# Patient Record
Sex: Female | Born: 1945 | Race: White | Hispanic: No
Health system: Southern US, Community
[De-identification: ages and names within clinical notes are randomized; demographics above are authoritative.]

## PROBLEM LIST (undated history)

## (undated) DIAGNOSIS — E119 Type 2 diabetes mellitus without complications: Secondary | ICD-10-CM

## (undated) DIAGNOSIS — I10 Essential (primary) hypertension: Secondary | ICD-10-CM

## (undated) DIAGNOSIS — Z87891 Personal history of nicotine dependence: Principal | ICD-10-CM

## (undated) HISTORY — DX: Personal history of nicotine dependence: Z87.891

---

## 2005-03-28 ENCOUNTER — Ambulatory Visit: Payer: Self-pay | Admitting: Internal Medicine

## 2006-04-04 ENCOUNTER — Ambulatory Visit: Payer: Self-pay | Admitting: Internal Medicine

## 2007-04-06 ENCOUNTER — Ambulatory Visit: Payer: Self-pay | Admitting: Internal Medicine

## 2008-04-07 ENCOUNTER — Ambulatory Visit: Payer: Self-pay | Admitting: Internal Medicine

## 2009-04-09 ENCOUNTER — Ambulatory Visit: Payer: Self-pay | Admitting: Internal Medicine

## 2010-04-13 ENCOUNTER — Ambulatory Visit: Payer: Self-pay | Admitting: Internal Medicine

## 2011-06-04 ENCOUNTER — Ambulatory Visit: Payer: Self-pay | Admitting: Specialist

## 2012-03-16 ENCOUNTER — Ambulatory Visit: Payer: Self-pay | Admitting: Ophthalmology

## 2012-04-17 ENCOUNTER — Ambulatory Visit: Payer: Self-pay | Admitting: Internal Medicine

## 2012-05-30 ENCOUNTER — Ambulatory Visit: Payer: Self-pay

## 2012-07-11 ENCOUNTER — Ambulatory Visit: Payer: Self-pay | Admitting: Neurology

## 2012-07-11 LAB — CREATININE, SERUM: Creatinine: 0.63 mg/dL (ref 0.60–1.30)

## 2013-04-18 ENCOUNTER — Ambulatory Visit: Payer: Self-pay

## 2013-07-25 ENCOUNTER — Ambulatory Visit: Payer: Self-pay

## 2014-04-21 ENCOUNTER — Ambulatory Visit: Payer: Self-pay

## 2015-01-18 NOTE — Op Note (Signed)
PATIENT NAME:  Sonia Watkins, Sonia Watkins MR#:  [id number] DATE OF BIRTH:  12/29/1945  DATE OF PROCEDURE:  03/16/2012  SURGEON: Viviann SpareSteven A. Payslie Mccaig, M.D.   PREOPERATIVE DIAGNOSIS:  Cataract, right eye.   POSTOPERATIVE DIAGNOSIS:  Cataract, right eye.  PROCEDURE PERFORMED:  Extracapsular cataract extraction using phacoemulsification with placement of an Alcon [id number], 20.0-diopter posterior chamber lens, serial #[id number].  SURGEON:  Maylon PeppersSteven A. Langley Flatley, MD  ASSISTANT:  None.  ANESTHESIA:  4% lidocaine and 0.75% Marcaine in a 50/50 mixture with 10 units/mL of Hylenex added, given as a peribulbar.  ANESTHESIOLOGIST:  Yves DillPaul Carroll, MD   COMPLICATIONS:  None.  ESTIMATED BLOOD LOSS:  Less than 1 mL.  DESCRIPTION OF PROCEDURE:  The patient was brought to the operating room and given a peribulbar block.  The patient was then prepped and draped in the usual fashion.  The vertical rectus muscles were imbricated using 5-0 silk sutures.  These sutures were then clamped to the sterile drapes as bridle sutures.  A limbal peritomy was performed extending two clock hours and hemostasis was obtained with cautery.  A partial thickness scleral groove was made at the surgical limbus and dissected anteriorly in a lamellar dissection using an Alcon crescent knife.  The anterior chamber was entered superonasally with a Superblade and through the lamellar dissection with a 2.6 mm keratome.  DisCoVisc was used to replace the aqueous and a continuous tear capsulorrhexis was carried out.  Hydrodissection and hydrodelineation were carried out with balanced salt and a 27 gauge canula.  The nucleus was rotated to confirm the effectiveness of the hydrodissection.  Phacoemulsification was carried out using a divide-and-conquer technique.  Total ultrasound time was 1 minute and 29 seconds with an average power of 23.5 percent.  CDE of 40.09.  Irrigation/aspiration was used to remove the residual cortex.  DisCoVisc was used  to inflate the capsule and the internal incision was enlarged to 3 mm with the crescent knife.  The intraocular lens was folded and inserted into the capsular bag using the AcrySert Delivery System.   Irrigation/aspiration was used to remove the residual DisCoVisc.  Miostat was injected into the anterior chamber through the paracentesis track to inflate the anterior chamber and induce miosis.  The wound was checked for leaks and none were found. The conjunctiva was closed with cautery and the bridle sutures were removed.  Two drops of 0.3% Vigamox were placed on the eye.   An eye shield was placed on the eye.  The patient was discharged to the recovery room in good condition.  ____________________________ Maylon PeppersSteven A. Viktor Philipp, MD sad:cbb D: 03/16/2012 12:09:22 ET T: 03/16/2012 12:34:27 ET JOB#: [id number]  cc: Viviann SpareSteven A. Wlliam Grosso, MD, <Dictator> Erline LevineSTEVEN A Roberta Kelly MD ELECTRONICALLY SIGNED 03/19/2012 14:37

## 2015-04-08 ENCOUNTER — Other Ambulatory Visit: Payer: Self-pay | Admitting: Family Medicine

## 2015-04-08 ENCOUNTER — Encounter: Payer: Self-pay | Admitting: Family Medicine

## 2015-04-08 DIAGNOSIS — Z87891 Personal history of nicotine dependence: Secondary | ICD-10-CM

## 2015-04-08 HISTORY — DX: Personal history of nicotine dependence: Z87.891

## 2015-04-09 ENCOUNTER — Other Ambulatory Visit: Payer: Self-pay | Admitting: Family Medicine

## 2015-04-09 DIAGNOSIS — Z87891 Personal history of nicotine dependence: Secondary | ICD-10-CM

## 2015-04-10 ENCOUNTER — Ambulatory Visit
Admission: RE | Admit: 2015-04-10 | Discharge: 2015-04-10 | Disposition: A | Discharge status: Home / Self Care | Payer: Medicare Other | Source: Ambulatory Visit | Attending: Family Medicine | Admitting: Family Medicine

## 2015-04-10 ENCOUNTER — Inpatient Hospital Stay: Payer: Medicare Other | Attending: Family Medicine | Admitting: Family Medicine

## 2015-04-10 DIAGNOSIS — Z87891 Personal history of nicotine dependence: Secondary | ICD-10-CM | POA: Insufficient documentation

## 2015-04-10 DIAGNOSIS — Z122 Encounter for screening for malignant neoplasm of respiratory organs: Secondary | ICD-10-CM

## 2015-04-10 DIAGNOSIS — I2510 Atherosclerotic heart disease of native coronary artery without angina pectoris: Secondary | ICD-10-CM | POA: Insufficient documentation

## 2015-04-10 NOTE — Progress Notes (Signed)
In accordance with CMS guidelines, patient has meet eligibility criteria including age, absence of signs or symptoms of lung cancer, the specific calculation of cigarette smoking pack-years was 95 years and is a former smoker having quit recently in early 2016.   A shared decision-making session was conducted prior to the performance of CT scan. This includes one or more decision aids, includes benefits and harms of screening, follow-up diagnostic testing, over-diagnosis, false positive rate, and total radiation exposure.  Counseling on the importance of adherence to annual lung cancer LDCT screening, impact of co-morbidities, and ability or willingness to undergo diagnosis and treatment is imperative for compliance of the program.  Counseling on the importance of continued smoking cessation for former smokers; the importance of smoking cessation for current smokers and information about tobacco cessation interventions have been given to patient including the [place] [facility], [address] quit [place], as well as Cancer Center specific smoking cessation programs.  Written order for lung cancer screening with LDCT has been given to the patient and any and all questions have been answered to the best of my abilities.   Yearly follow up will be scheduled by Glenna FellowsShawn Perkins, Thoracic Navigator.

## 2015-04-13 ENCOUNTER — Telehealth: Payer: Self-pay | Admitting: *Deleted

## 2015-04-13 NOTE — Telephone Encounter (Signed)
  Oncology Nurse Navigator Documentation    Navigator Encounter Type: Telephone;Screening (04/13/15 1400)               Notified patient of LDCT lung cancer screening results of Lung Rads  2 finding with recommendation for 12 month follow up imaging. Also notified of incidental finding noted below. Verbalized understanding.   IMPRESSION: 1. Lung-RADS Category 2, benign appearance or behavior. Continue annual screening with low-dose chest CT without contrast in 12 months. 2. Coronary artery calcification.

## 2016-03-18 ENCOUNTER — Other Ambulatory Visit: Payer: Self-pay | Admitting: Specialist

## 2016-03-18 DIAGNOSIS — M7541 Impingement syndrome of right shoulder: Secondary | ICD-10-CM

## 2016-03-18 DIAGNOSIS — M7542 Impingement syndrome of left shoulder: Secondary | ICD-10-CM

## 2016-04-05 ENCOUNTER — Telehealth: Payer: Self-pay | Admitting: *Deleted

## 2016-04-05 NOTE — Telephone Encounter (Signed)
Left voicemail for patient notifyng them that it is time to schedule annual low dose lung cancer screening CT scan. Instructed patient to call back to verify information prior to the scan being scheduled.  

## 2016-04-07 ENCOUNTER — Ambulatory Visit
Admission: RE | Admit: 2016-04-07 | Discharge: 2016-04-07 | Disposition: A | Discharge status: Home / Self Care | Payer: Medicare Other | Source: Ambulatory Visit | Attending: Specialist | Admitting: Specialist

## 2016-04-07 DIAGNOSIS — M7541 Impingement syndrome of right shoulder: Secondary | ICD-10-CM | POA: Insufficient documentation

## 2016-04-07 DIAGNOSIS — M19012 Primary osteoarthritis, left shoulder: Secondary | ICD-10-CM | POA: Diagnosis not present

## 2016-04-07 DIAGNOSIS — M67814 Other specified disorders of tendon, left shoulder: Secondary | ICD-10-CM | POA: Insufficient documentation

## 2016-04-07 DIAGNOSIS — M7542 Impingement syndrome of left shoulder: Secondary | ICD-10-CM | POA: Diagnosis present

## 2016-04-07 DIAGNOSIS — M67813 Other specified disorders of tendon, right shoulder: Secondary | ICD-10-CM | POA: Diagnosis not present

## 2016-04-07 DIAGNOSIS — M7551 Bursitis of right shoulder: Secondary | ICD-10-CM | POA: Diagnosis not present

## 2016-04-07 DIAGNOSIS — M25411 Effusion, right shoulder: Secondary | ICD-10-CM | POA: Insufficient documentation

## 2016-05-11 ENCOUNTER — Telehealth: Payer: Self-pay | Admitting: *Deleted

## 2016-05-11 NOTE — Telephone Encounter (Signed)
Notified patient that it is time to have lung cancer screening scan done. Verified patient's age, no signs of lung cancer, no illness that would prevent patient from receiving treatment for lung cancer, and smoking history (quit 2016, 95pack year). Patient is expecting call from scheduling with appointment for screening scan and knows to call me with any questions.

## 2016-05-26 ENCOUNTER — Other Ambulatory Visit: Payer: Self-pay | Admitting: *Deleted

## 2016-05-26 DIAGNOSIS — Z87891 Personal history of nicotine dependence: Secondary | ICD-10-CM

## 2016-06-03 ENCOUNTER — Ambulatory Visit
Admission: RE | Admit: 2016-06-03 | Discharge: 2016-06-03 | Disposition: A | Discharge status: Home / Self Care | Payer: Medicare Other | Source: Ambulatory Visit | Attending: Oncology | Admitting: Oncology

## 2016-06-03 DIAGNOSIS — I2510 Atherosclerotic heart disease of native coronary artery without angina pectoris: Secondary | ICD-10-CM | POA: Insufficient documentation

## 2016-06-03 DIAGNOSIS — I700 Atherosclerosis of aorta: Secondary | ICD-10-CM | POA: Diagnosis not present

## 2016-06-03 DIAGNOSIS — Z87891 Personal history of nicotine dependence: Secondary | ICD-10-CM | POA: Diagnosis not present

## 2016-06-13 ENCOUNTER — Telehealth: Payer: Self-pay | Admitting: *Deleted

## 2016-06-13 NOTE — Telephone Encounter (Signed)
Voicemail left for patient to call back to be notified of LDCT lung cancer screening results with recommendation for 12 month follow up imaging. Also will be notified of incidental finding noted below.  IMPRESSION: 1. Lung-RADS Category 2, benign appearance or behavior. Continue annual screening with low-dose chest CT without contrast in 12 months 2. Aortic atherosclerosis  and coronary artery calcification

## 2017-05-23 ENCOUNTER — Telehealth: Payer: Self-pay | Admitting: *Deleted

## 2017-05-23 DIAGNOSIS — Z87891 Personal history of nicotine dependence: Secondary | ICD-10-CM

## 2017-05-23 DIAGNOSIS — Z122 Encounter for screening for malignant neoplasm of respiratory organs: Secondary | ICD-10-CM

## 2017-05-23 NOTE — Telephone Encounter (Signed)
Notified patient that annual lung cancer screening low dose CT scan is due currently or will be in near future. Confirmed that patient is within the age range of 55-77, and asymptomatic, (no signs or symptoms of lung cancer). Patient denies illness that would prevent curative treatment for lung cancer if found. Verified smoking history, (current, 96 pack year). The shared decision making visit was done 04/10/15. Patient is agreeable for CT scan being scheduled.

## 2017-05-23 NOTE — Telephone Encounter (Signed)
Left message on v/m with CT appointment info.

## 2017-06-06 ENCOUNTER — Ambulatory Visit
Admission: RE | Admit: 2017-06-06 | Discharge: 2017-06-06 | Disposition: A | Discharge status: Home / Self Care | Payer: Medicare Other | Source: Ambulatory Visit | Attending: Oncology | Admitting: Oncology

## 2017-06-06 DIAGNOSIS — Z87891 Personal history of nicotine dependence: Secondary | ICD-10-CM

## 2017-06-06 DIAGNOSIS — J439 Emphysema, unspecified: Secondary | ICD-10-CM | POA: Insufficient documentation

## 2017-06-06 DIAGNOSIS — I2510 Atherosclerotic heart disease of native coronary artery without angina pectoris: Secondary | ICD-10-CM | POA: Diagnosis not present

## 2017-06-06 DIAGNOSIS — Z122 Encounter for screening for malignant neoplasm of respiratory organs: Secondary | ICD-10-CM | POA: Insufficient documentation

## 2017-06-06 DIAGNOSIS — I700 Atherosclerosis of aorta: Secondary | ICD-10-CM | POA: Insufficient documentation

## 2017-06-12 ENCOUNTER — Encounter: Payer: Self-pay | Admitting: *Deleted

## 2018-05-19 ENCOUNTER — Telehealth: Payer: Self-pay

## 2018-05-19 NOTE — Telephone Encounter (Signed)
Call pt regarding lung screening pt is a current smoker, smokes about 1 pack per day. Would like scan any Wednesday Morning . PT will be out of town Sept 21st  thur 29th.

## 2018-05-21 ENCOUNTER — Other Ambulatory Visit: Payer: Self-pay | Admitting: *Deleted

## 2018-05-21 DIAGNOSIS — Z122 Encounter for screening for malignant neoplasm of respiratory organs: Secondary | ICD-10-CM

## 2018-05-22 ENCOUNTER — Telehealth: Payer: Self-pay | Admitting: *Deleted

## 2018-05-22 NOTE — Telephone Encounter (Signed)
Attempted to call patient about appointment for LDCT screening on Wednesday 06/13/2018 @ 11:45am here @ [facility], message left for patient and to call [phone] with questions or changes.

## 2018-06-13 ENCOUNTER — Ambulatory Visit
Admission: RE | Admit: 2018-06-13 | Discharge: 2018-06-13 | Disposition: A | Discharge status: Home / Self Care | Payer: Medicare Other | Source: Ambulatory Visit | Attending: Nurse Practitioner | Admitting: Nurse Practitioner

## 2018-06-13 DIAGNOSIS — Z87891 Personal history of nicotine dependence: Secondary | ICD-10-CM | POA: Insufficient documentation

## 2018-06-13 DIAGNOSIS — J439 Emphysema, unspecified: Secondary | ICD-10-CM | POA: Diagnosis not present

## 2018-06-13 DIAGNOSIS — Z122 Encounter for screening for malignant neoplasm of respiratory organs: Secondary | ICD-10-CM | POA: Diagnosis not present

## 2018-06-13 DIAGNOSIS — I700 Atherosclerosis of aorta: Secondary | ICD-10-CM | POA: Insufficient documentation

## 2018-06-14 ENCOUNTER — Encounter: Payer: Self-pay | Admitting: *Deleted

## 2019-01-31 ENCOUNTER — Other Ambulatory Visit: Payer: Self-pay | Admitting: Gastroenterology

## 2019-01-31 DIAGNOSIS — R1084 Generalized abdominal pain: Secondary | ICD-10-CM

## 2019-02-07 ENCOUNTER — Other Ambulatory Visit: Payer: Self-pay

## 2019-02-07 ENCOUNTER — Ambulatory Visit
Admission: RE | Admit: 2019-02-07 | Discharge: 2019-02-07 | Disposition: A | Discharge status: Home / Self Care | Payer: Medicare Other | Source: Ambulatory Visit | Attending: Gastroenterology | Admitting: Gastroenterology

## 2019-02-07 DIAGNOSIS — R1084 Generalized abdominal pain: Secondary | ICD-10-CM | POA: Insufficient documentation

## 2019-02-07 HISTORY — DX: Essential (primary) hypertension: I10

## 2019-02-07 HISTORY — DX: Type 2 diabetes mellitus without complications: E11.9

## 2019-02-07 MED ORDER — IOHEXOL 300 MG/ML  SOLN
100.0000 mL | Freq: Once | INTRAMUSCULAR | Status: AC | PRN
  Administered 2019-02-07: 85 mL via INTRAVENOUS

## 2019-06-14 ENCOUNTER — Telehealth: Payer: Self-pay | Admitting: *Deleted

## 2019-06-14 NOTE — Telephone Encounter (Signed)
Left message for patient to notify them that it is time to schedule annual low dose lung cancer screening CT scan. Instructed patient to call back to verify information prior to the scan being scheduled.  

## 2019-06-17 ENCOUNTER — Telehealth: Payer: Self-pay | Admitting: *Deleted

## 2019-06-17 DIAGNOSIS — Z87891 Personal history of nicotine dependence: Secondary | ICD-10-CM

## 2019-06-17 DIAGNOSIS — Z122 Encounter for screening for malignant neoplasm of respiratory organs: Secondary | ICD-10-CM

## 2019-06-17 NOTE — Telephone Encounter (Signed)
Patient has been notified that annual lung cancer screening low dose CT scan is due currently or will be in near future. Confirmed that patient is within the age range of 55-77, and asymptomatic, (no signs or symptoms of lung cancer). Patient denies illness that would prevent curative treatment for lung cancer if found. Verified smoking history, (current, 97 pack year). The shared decision making visit was done 04/10/15. Patient is agreeable for CT scan being scheduled.

## 2019-06-20 ENCOUNTER — Other Ambulatory Visit: Payer: Self-pay

## 2019-06-20 ENCOUNTER — Ambulatory Visit
Admission: RE | Admit: 2019-06-20 | Discharge: 2019-06-20 | Disposition: A | Discharge status: Home / Self Care | Payer: Medicare Other | Source: Ambulatory Visit | Attending: Nurse Practitioner | Admitting: Nurse Practitioner

## 2019-06-20 DIAGNOSIS — Z87891 Personal history of nicotine dependence: Secondary | ICD-10-CM | POA: Insufficient documentation

## 2019-06-20 DIAGNOSIS — Z122 Encounter for screening for malignant neoplasm of respiratory organs: Secondary | ICD-10-CM | POA: Insufficient documentation

## 2019-06-26 ENCOUNTER — Encounter: Payer: Self-pay | Admitting: *Deleted

## 2020-05-23 ENCOUNTER — Telehealth: Payer: Self-pay | Admitting: *Deleted

## 2020-05-23 NOTE — Telephone Encounter (Signed)
Pt notified that lung cancer screening imaging is due currently or in the near future. She is a current smoker, smokes one pack of cigarettes per day. Appoointment made for 06/25/2020 at 10:15 am.

## 2020-06-02 ENCOUNTER — Other Ambulatory Visit: Payer: Self-pay | Admitting: *Deleted

## 2020-06-02 DIAGNOSIS — Z122 Encounter for screening for malignant neoplasm of respiratory organs: Secondary | ICD-10-CM

## 2020-06-02 DIAGNOSIS — Z87891 Personal history of nicotine dependence: Secondary | ICD-10-CM

## 2020-06-25 ENCOUNTER — Ambulatory Visit
Admission: RE | Admit: 2020-06-25 | Discharge: 2020-06-25 | Disposition: A | Discharge status: Home / Self Care | Payer: Medicare Other | Source: Ambulatory Visit | Attending: Oncology | Admitting: Oncology

## 2020-06-25 ENCOUNTER — Other Ambulatory Visit: Payer: Self-pay

## 2020-06-25 DIAGNOSIS — Z87891 Personal history of nicotine dependence: Secondary | ICD-10-CM | POA: Insufficient documentation

## 2020-06-25 DIAGNOSIS — Z122 Encounter for screening for malignant neoplasm of respiratory organs: Secondary | ICD-10-CM | POA: Diagnosis present

## 2020-06-29 ENCOUNTER — Encounter: Payer: Self-pay | Admitting: *Deleted

## 2021-11-09 ENCOUNTER — Telehealth: Payer: Self-pay | Admitting: Acute Care

## 2021-11-09 NOTE — Telephone Encounter (Signed)
Left message for pt to call to schedule f/u lung screening CT scan.  °

## 2022-06-03 IMAGING — CT CT CHEST LUNG CANCER SCREENING LOW DOSE W/O CM
2 of 5 series · 15 of 40 positions shown, 18 images · non-contrast
Comparison: 06/20/2019

CLINICAL DATA: Ninety pack-year smoking history.  Current smoker.

EXAM:
CT CHEST WITHOUT CONTRAST LOW-DOSE FOR LUNG CANCER SCREENING
TECHNIQUE: Multidetector CT imaging of the chest was performed following the
standard protocol without IV contrast.

[Series 3: lung 1.00 · axial · 0.60mm/px · z∈[-1199,-899]mm · 12 of 332 slices shown, 15 images]
[im 16/332  mediastinal]
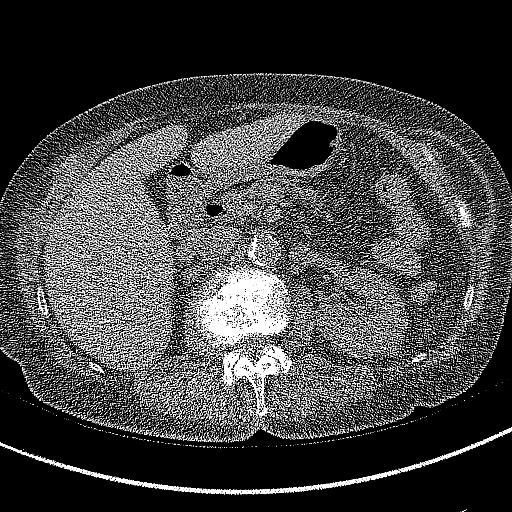
[im 16/332  lung]
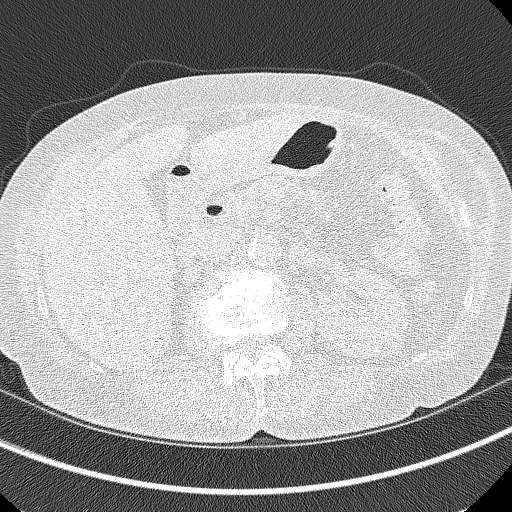
[im 46/332  lung]
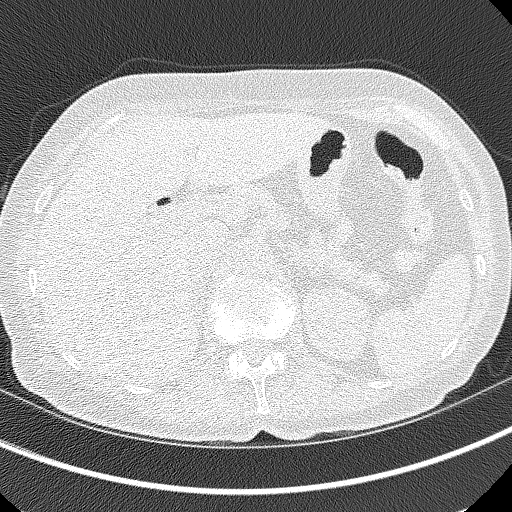
[im 76/332  lung]
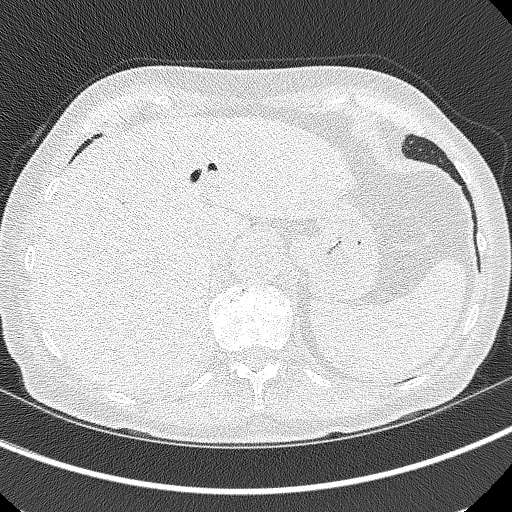
[im 106/332  lung]
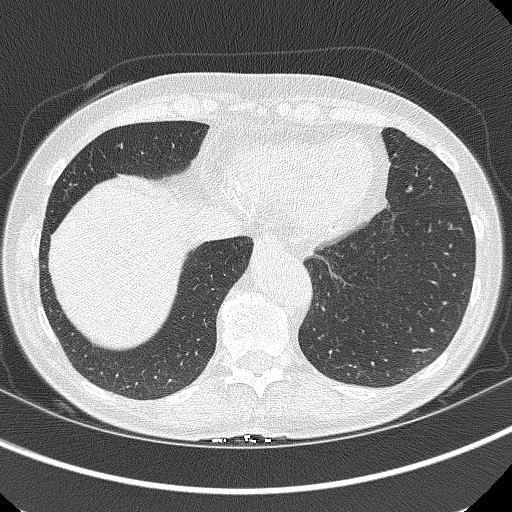
[im 121/332  mediastinal]
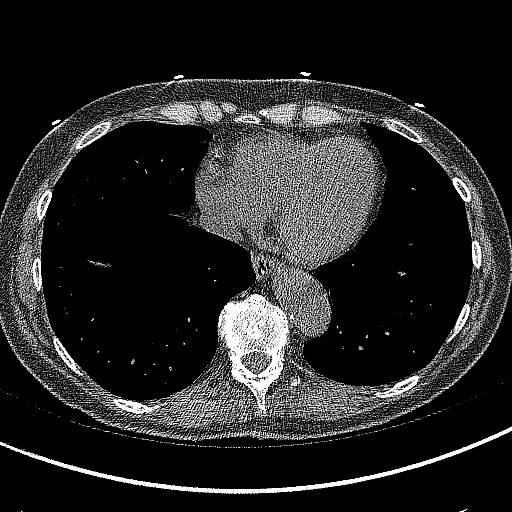
[im 121/332  lung]
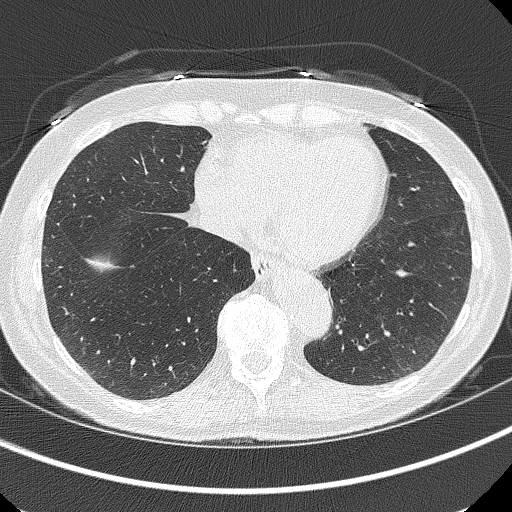
[im 151/332  lung]
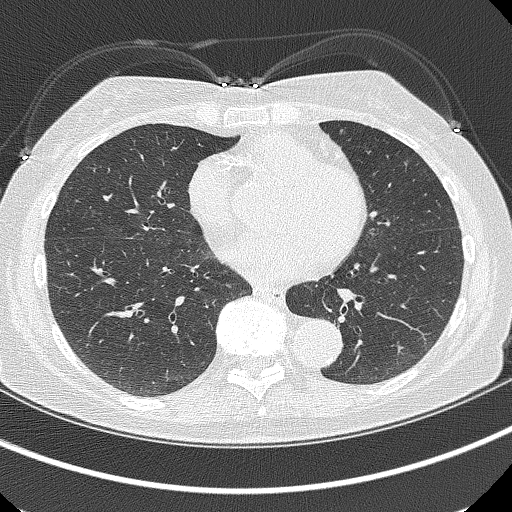
[im 181/332  lung]
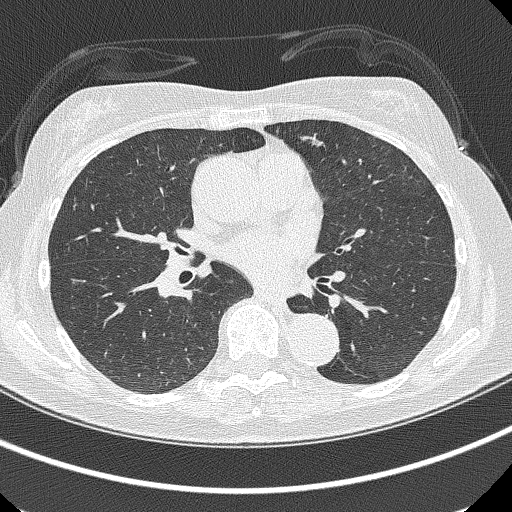
[im 211/332  lung]
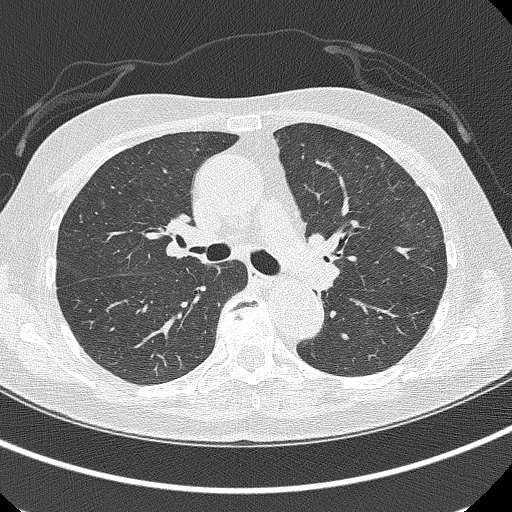
[im 226/332  mediastinal]
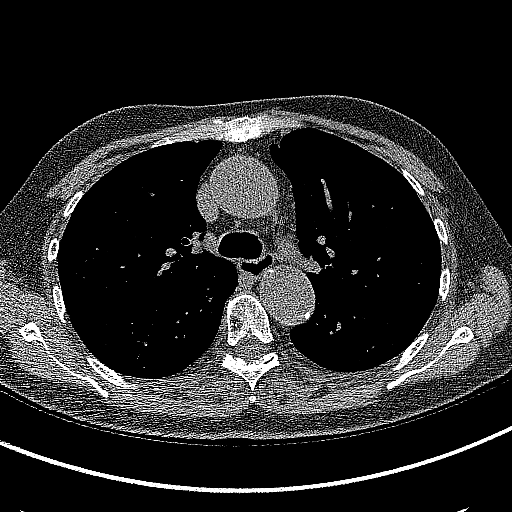
[im 226/332  lung]
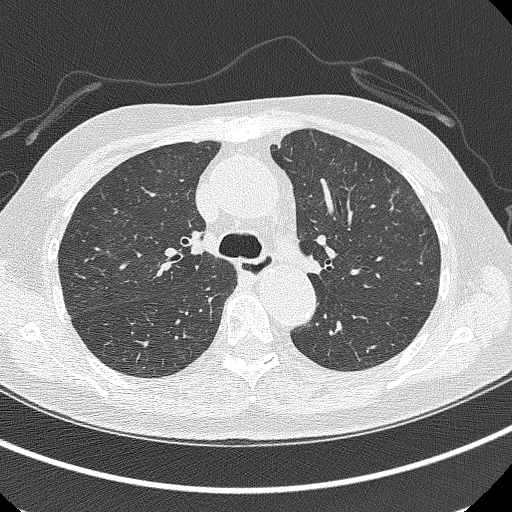
[im 256/332  lung]
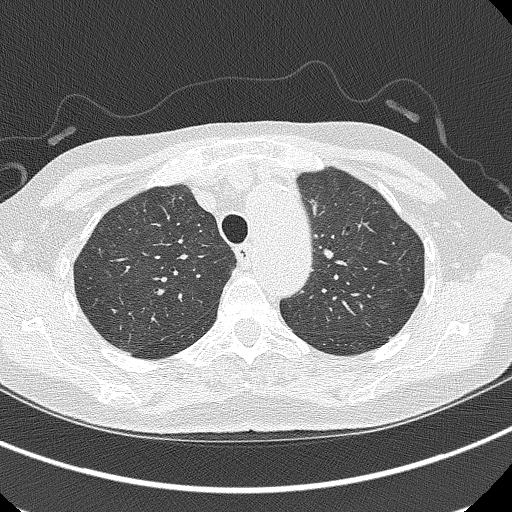
[im 286/332  lung]
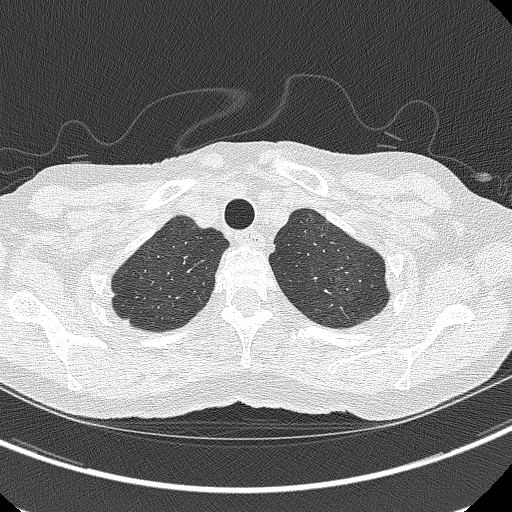
[im 316/332  lung]
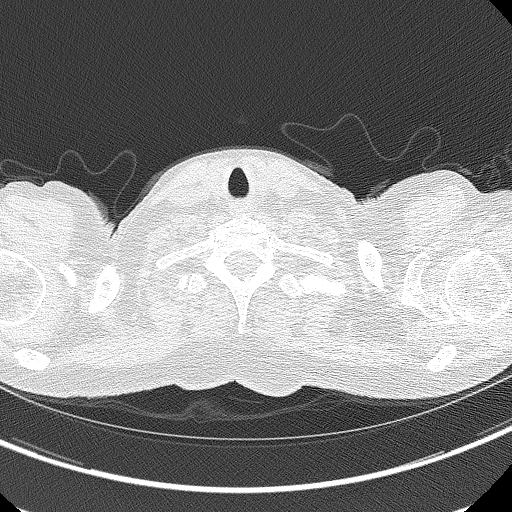

[Series 4: coronals lung 1.00 cor · coronal · 0.60mm/px · 3 of 228 slices shown]
[im 46/228  lung]
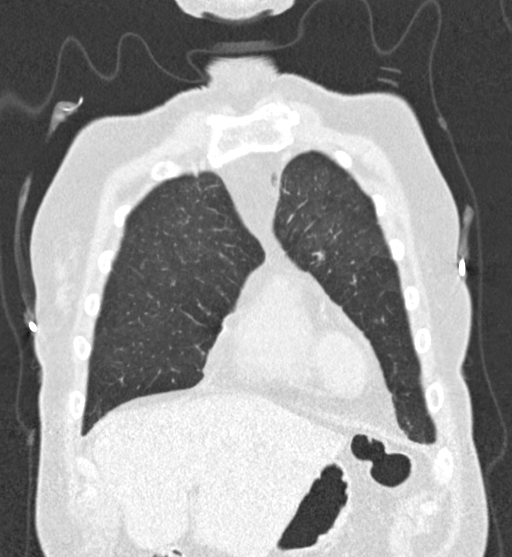
[im 91/228  lung]
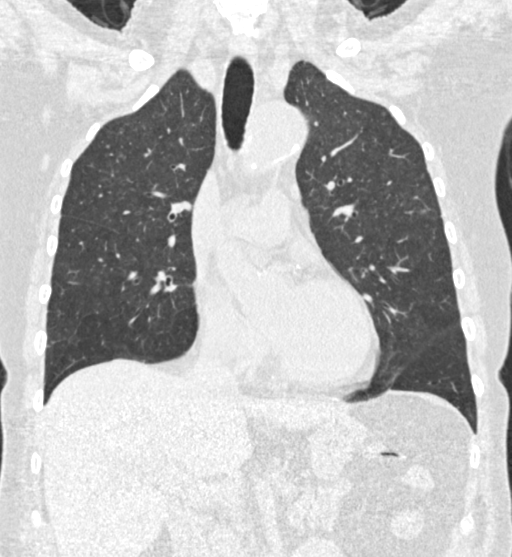
[im 137/228  lung]
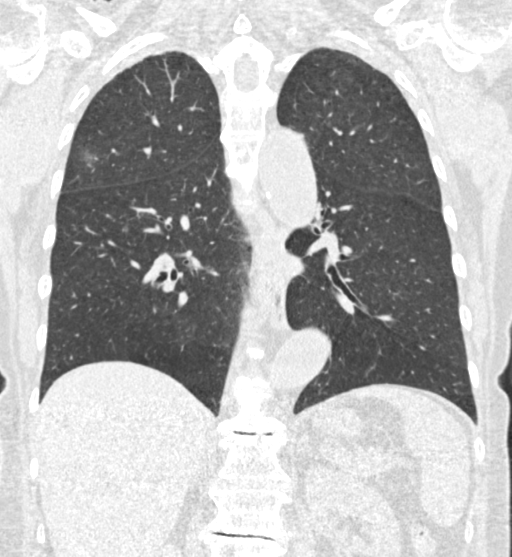

[15 of 40 positions shown; findings below may reference images not displayed]

FINDINGS: Cardiovascular: Aortic atherosclerosis. Tortuous thoracic aorta.
Normal heart size, without pericardial effusion. Multivessel
coronary artery atherosclerosis.

Mediastinum/Nodes: No mediastinal or definite hilar adenopathy,
given limitations of unenhanced CT. Fluid level in the esophagus on
[DATE].

Lungs/Pleura: No pleural fluid. Moderate centrilobular emphysema.
Primarily similar solid and non solid pulmonary nodules. A new
posterior right upper lobe non solid nodule of volume derived
equivalent diameter 10.8 mm.

Upper Abdomen: Pneumobilia. Normal imaged portions of the spleen,
stomach, pancreas, adrenal glands, left kidney.

Musculoskeletal: Osteopenia.  Thoracic spondylosis.
IMPRESSION: 1. Lung-RADS 2, benign appearance or behavior. Continue annual
screening with low-dose chest CT without contrast in 12 months.
2. Aortic atherosclerosis ([id number].T), coronary artery
atherosclerosis and emphysema ([id number].J).
3. Esophageal air fluid level suggests dysmotility or
gastroesophageal reflux.

## 2023-02-01 ENCOUNTER — Other Ambulatory Visit: Payer: Self-pay | Admitting: Internal Medicine

## 2023-02-01 DIAGNOSIS — J438 Other emphysema: Secondary | ICD-10-CM

## 2023-02-01 DIAGNOSIS — J432 Centrilobular emphysema: Secondary | ICD-10-CM

## 2023-02-01 DIAGNOSIS — F17209 Nicotine dependence, unspecified, with unspecified nicotine-induced disorders: Secondary | ICD-10-CM

## 2023-02-01 DIAGNOSIS — F17210 Nicotine dependence, cigarettes, uncomplicated: Secondary | ICD-10-CM

## 2023-02-22 ENCOUNTER — Ambulatory Visit
Admission: RE | Admit: 2023-02-22 | Discharge: 2023-02-22 | Disposition: A | Discharge status: Home / Self Care | Payer: Medicare Other | Source: Ambulatory Visit | Attending: Internal Medicine | Admitting: Internal Medicine

## 2023-02-22 DIAGNOSIS — F17209 Nicotine dependence, unspecified, with unspecified nicotine-induced disorders: Secondary | ICD-10-CM | POA: Insufficient documentation

## 2023-02-22 DIAGNOSIS — F17210 Nicotine dependence, cigarettes, uncomplicated: Secondary | ICD-10-CM | POA: Diagnosis present

## 2023-02-22 DIAGNOSIS — J432 Centrilobular emphysema: Secondary | ICD-10-CM | POA: Insufficient documentation

## 2023-02-22 DIAGNOSIS — J438 Other emphysema: Secondary | ICD-10-CM | POA: Insufficient documentation

## 2023-11-29 ENCOUNTER — Other Ambulatory Visit: Payer: Self-pay | Admitting: Emergency Medicine

## 2023-11-29 DIAGNOSIS — F17210 Nicotine dependence, cigarettes, uncomplicated: Secondary | ICD-10-CM

## 2023-11-29 DIAGNOSIS — Z122 Encounter for screening for malignant neoplasm of respiratory organs: Secondary | ICD-10-CM

## 2023-12-05 ENCOUNTER — Ambulatory Visit
Admission: RE | Admit: 2023-12-05 | Discharge: 2023-12-05 | Disposition: A | Discharge status: Home / Self Care | Source: Ambulatory Visit | Attending: Emergency Medicine | Admitting: Emergency Medicine

## 2023-12-05 DIAGNOSIS — R911 Solitary pulmonary nodule: Secondary | ICD-10-CM | POA: Insufficient documentation

## 2023-12-05 DIAGNOSIS — J432 Centrilobular emphysema: Secondary | ICD-10-CM | POA: Diagnosis not present

## 2023-12-05 DIAGNOSIS — N2889 Other specified disorders of kidney and ureter: Secondary | ICD-10-CM | POA: Insufficient documentation

## 2023-12-05 DIAGNOSIS — I700 Atherosclerosis of aorta: Secondary | ICD-10-CM | POA: Insufficient documentation

## 2023-12-05 DIAGNOSIS — I2510 Atherosclerotic heart disease of native coronary artery without angina pectoris: Secondary | ICD-10-CM | POA: Diagnosis not present

## 2023-12-05 DIAGNOSIS — Z122 Encounter for screening for malignant neoplasm of respiratory organs: Secondary | ICD-10-CM | POA: Insufficient documentation

## 2023-12-05 DIAGNOSIS — F17210 Nicotine dependence, cigarettes, uncomplicated: Secondary | ICD-10-CM | POA: Insufficient documentation

## 2024-01-02 ENCOUNTER — Other Ambulatory Visit: Payer: Self-pay | Admitting: Emergency Medicine

## 2024-01-02 DIAGNOSIS — F17210 Nicotine dependence, cigarettes, uncomplicated: Secondary | ICD-10-CM

## 2024-01-02 DIAGNOSIS — R918 Other nonspecific abnormal finding of lung field: Secondary | ICD-10-CM

## 2024-01-02 DIAGNOSIS — J438 Other emphysema: Secondary | ICD-10-CM

## 2024-01-02 DIAGNOSIS — J219 Acute bronchiolitis, unspecified: Secondary | ICD-10-CM

## 2024-01-05 ENCOUNTER — Ambulatory Visit
Admission: RE | Admit: 2024-01-05 | Discharge: 2024-01-05 | Disposition: A | Discharge status: Home / Self Care | Source: Ambulatory Visit | Attending: Emergency Medicine | Admitting: Emergency Medicine

## 2024-01-05 DIAGNOSIS — R918 Other nonspecific abnormal finding of lung field: Secondary | ICD-10-CM | POA: Diagnosis present

## 2024-01-05 DIAGNOSIS — J219 Acute bronchiolitis, unspecified: Secondary | ICD-10-CM | POA: Insufficient documentation

## 2024-01-05 DIAGNOSIS — J438 Other emphysema: Secondary | ICD-10-CM | POA: Insufficient documentation

## 2024-01-05 DIAGNOSIS — F17210 Nicotine dependence, cigarettes, uncomplicated: Secondary | ICD-10-CM | POA: Insufficient documentation

## 2024-01-05 LAB — GLUCOSE, CAPILLARY: Glucose-Capillary: 108 mg/dL — ABNORMAL HIGH (ref 70–99)

## 2024-01-05 MED ORDER — FLUDEOXYGLUCOSE F - 18 (FDG) INJECTION
5.5800 | Freq: Once | INTRAVENOUS | Status: AC | PRN
  Administered 2024-01-05: 5.58 via INTRAVENOUS

## 2024-02-08 ENCOUNTER — Other Ambulatory Visit: Payer: Self-pay | Admitting: Pulmonary Disease

## 2024-02-08 DIAGNOSIS — J432 Centrilobular emphysema: Secondary | ICD-10-CM

## 2024-02-08 DIAGNOSIS — R911 Solitary pulmonary nodule: Secondary | ICD-10-CM

## 2024-12-02 ENCOUNTER — Encounter
